# Patient Record
Sex: Male | Born: 1970 | Race: Black or African American | Hispanic: No | Marital: Married | Smoking: Never smoker
Health system: Southern US, Community
[De-identification: ages and names within clinical notes are randomized; demographics above are authoritative.]

## PROBLEM LIST (undated history)

## (undated) DIAGNOSIS — N200 Calculus of kidney: Secondary | ICD-10-CM

## (undated) HISTORY — PX: ARTHROSCOPIC REPAIR ACL: SUR80

---

## 2001-07-13 ENCOUNTER — Encounter: Payer: Self-pay | Admitting: Emergency Medicine

## 2001-07-13 ENCOUNTER — Emergency Department (HOSPITAL_COMMUNITY): Admission: EM | Admit: 2001-07-13 | Discharge: 2001-07-13 | Payer: Self-pay | Admitting: Emergency Medicine

## 2006-11-17 ENCOUNTER — Emergency Department: Payer: Self-pay | Admitting: Emergency Medicine

## 2006-11-24 ENCOUNTER — Ambulatory Visit: Payer: Self-pay | Admitting: Specialist

## 2020-04-16 ENCOUNTER — Encounter (HOSPITAL_BASED_OUTPATIENT_CLINIC_OR_DEPARTMENT_OTHER): Payer: Self-pay | Admitting: *Deleted

## 2020-04-16 ENCOUNTER — Other Ambulatory Visit: Payer: Self-pay

## 2020-04-16 ENCOUNTER — Emergency Department (HOSPITAL_BASED_OUTPATIENT_CLINIC_OR_DEPARTMENT_OTHER)
Admission: EM | Admit: 2020-04-16 | Discharge: 2020-04-16 | Disposition: A | Discharge status: Home / Self Care | Payer: No Typology Code available for payment source | Attending: Emergency Medicine | Admitting: Emergency Medicine

## 2020-04-16 ENCOUNTER — Emergency Department (HOSPITAL_BASED_OUTPATIENT_CLINIC_OR_DEPARTMENT_OTHER): Payer: No Typology Code available for payment source

## 2020-04-16 DIAGNOSIS — N132 Hydronephrosis with renal and ureteral calculous obstruction: Secondary | ICD-10-CM | POA: Diagnosis not present

## 2020-04-16 DIAGNOSIS — Z87442 Personal history of urinary calculi: Secondary | ICD-10-CM | POA: Insufficient documentation

## 2020-04-16 DIAGNOSIS — N23 Unspecified renal colic: Secondary | ICD-10-CM

## 2020-04-16 DIAGNOSIS — R109 Unspecified abdominal pain: Secondary | ICD-10-CM | POA: Diagnosis present

## 2020-04-16 HISTORY — DX: Calculus of kidney: N20.0

## 2020-04-16 LAB — URINALYSIS, MICROSCOPIC (REFLEX): WBC, UA: NONE SEEN WBC/hpf (ref 0–5)

## 2020-04-16 LAB — URINALYSIS, ROUTINE W REFLEX MICROSCOPIC
Bilirubin Urine: NEGATIVE
Glucose, UA: NEGATIVE mg/dL
Ketones, ur: NEGATIVE mg/dL
Leukocytes,Ua: NEGATIVE
Nitrite: NEGATIVE
Protein, ur: NEGATIVE mg/dL
Specific Gravity, Urine: 1.025 (ref 1.005–1.030)
pH: 6.5 (ref 5.0–8.0)

## 2020-04-16 MED ORDER — TAMSULOSIN HCL 0.4 MG PO CAPS: 0.4000 mg | ORAL_CAPSULE | Freq: Every day | ORAL | 0 refills | Status: AC

## 2020-04-16 MED ORDER — IBUPROFEN 800 MG PO TABS
800.0000 mg | ORAL_TABLET | Freq: Once | ORAL | Status: AC
  Administered 2020-04-16: 800 mg via ORAL
  Filled 2020-04-16: qty 1

## 2020-04-16 MED ORDER — IBUPROFEN 800 MG PO TABS: 800.0000 mg | ORAL_TABLET | Freq: Three times a day (TID) | ORAL | 0 refills | Status: AC

## 2020-04-16 MED ORDER — HYDROCODONE-ACETAMINOPHEN 5-325 MG PO TABS
2.0000 | ORAL_TABLET | Freq: Once | ORAL | Status: AC
  Administered 2020-04-16: 2 via ORAL
  Filled 2020-04-16: qty 2

## 2020-04-16 MED ORDER — HYDROCODONE-ACETAMINOPHEN 5-325 MG PO TABS
1.0000 | ORAL_TABLET | Freq: Four times a day (QID) | ORAL | 0 refills | Status: AC | PRN
Start: 2020-04-16 — End: ?

## 2020-04-16 MED ORDER — ONDANSETRON 4 MG PO TBDP: ORAL_TABLET | ORAL | 0 refills | Status: AC

## 2020-04-16 MED ORDER — ONDANSETRON 4 MG PO TBDP
4.0000 mg | ORAL_TABLET | Freq: Once | ORAL | Status: AC
Start: 2020-04-16 — End: 2020-04-16
  Administered 2020-04-16: 4 mg via ORAL
  Filled 2020-04-16: qty 1

## 2020-04-16 NOTE — ED Provider Notes (Signed)
[facility] EMERGENCY DEPARTMENT Provider Note   CSN: [id number] Arrival date & time: 04/16/20  1639     History Chief Complaint  Patient presents with  . Flank Pain    Wayne Green is a 49 y.o. male history of kidney stones who presented with acute onset left flank pain.  Patient has some soreness in the left flank area for the last week or so.  Patient was at work today and had acute onset of sharp left flank pain.  He states that he was so bad that he had to lay on the floor.  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  He had one episode of vomiting as well.  Denies any fevers or chills.  He states that he has a history of kidney stones.  He was diagnosed with kidney stones several months ago but never followed up with urology.  The history is provided by the patient.       Past Medical History:  Diagnosis Date  . Kidney calculi     There are no problems to display for this patient.   Past Surgical History:  Procedure Laterality Date  . ARTHROSCOPIC REPAIR ACL         No family history on file.  Social History   Tobacco Use  . Smoking status: Never Smoker  Substance Use Topics  . Alcohol use: Not Currently  . Drug use: Not Currently    Home Medications Prior to Admission medications   Not on File    Allergies    Patient has no known allergies.  Review of Systems   Review of Systems  Genitourinary: Positive for flank pain.  All other systems reviewed and are negative.   Physical Exam Updated Vital Signs BP 139/90   Pulse 73   Temp 98 F (36.7 C)   Resp 16   Ht 6\' 1"  (1.854 m)   Wt 111.1 kg   SpO2 100%   BMI 32.32 kg/m   Physical Exam Vitals and nursing note reviewed.  Constitutional:      Appearance: Normal appearance.  HENT:     Head: Normocephalic.     Nose: Nose normal.     Mouth/Throat:     Mouth: Mucous membranes are moist.  Eyes:     Extraocular Movements: Extraocular movements intact.     Pupils: Pupils are  equal, round, and reactive to light.  Cardiovascular:     Rate and Rhythm: Normal rate and regular rhythm.     Pulses: Normal pulses.     Heart sounds: Normal heart sounds.  Pulmonary:     Effort: Pulmonary effort is normal.     Breath sounds: Normal breath sounds.  Abdominal:     General: Abdomen is flat.     Palpations: Abdomen is soft.     Comments: + mild L CVAT   Musculoskeletal:        General: Normal range of motion.     Cervical back: Normal range of motion.  Skin:    General: Skin is warm.     Capillary Refill: Capillary refill takes less than 2 seconds.  Neurological:     General: No focal deficit present.     Mental Status: He is alert and oriented to person, place, and time.  Psychiatric:        Mood and Affect: Mood normal.        Behavior: Behavior normal.     ED Results / Procedures / Treatments  Labs (all labs ordered are listed, but only abnormal results are displayed) Labs Reviewed  URINALYSIS, ROUTINE W REFLEX MICROSCOPIC - Abnormal; Notable for the following components:      Result Value   Hgb urine dipstick TRACE (*)    All other components within normal limits  URINALYSIS, MICROSCOPIC (REFLEX) - Abnormal; Notable for the following components:   Bacteria, UA RARE (*)    All other components within normal limits    EKG None  Radiology CT Renal Stone Study  Result Date: 04/16/2020 CLINICAL DATA:  Flank pain, stone disease suspected, sudden flank pain for 2 hours, history of urolithiasis EXAM: CT ABDOMEN AND PELVIS WITHOUT CONTRAST TECHNIQUE: Multidetector CT imaging of the abdomen and pelvis was performed following the standard protocol without IV contrast. COMPARISON:  None. FINDINGS: Lower chest: Lung bases are clear. Normal heart size. No pericardial effusion. Hepatobiliary: No visible focal liver lesion within limitations of this unenhanced CT. Normal liver attenuation. Smooth liver surface contour. Gallbladder partially decompressed. No gross  gallbladder abnormality. No visible calcified gallstones are biliary ductal dilatation. Pancreas: No pancreatic ductal dilatation or surrounding inflammatory changes. Spleen: Normal in size. No concerning splenic lesions. Adrenals/Urinary Tract: Normal adrenal glands. Kidneys are symmetric in size and normally located. No concerning focal renal lesion. Mild left hydroureteronephrosis to the level of the urinary bladder where a 5 mm calculus is seen positioned at the left ureterovesicular junction. No other visible urolithiasis. No right urinary tract dilatation. Some mild bladder wall thickening may be related to underdistention or reactive change though could correlate with urinalysis. Stomach/Bowel: Small sliding-type hiatal hernia. Distal stomach and duodenum are unremarkable with normal sweep across the midline abdomen. No small bowel thickening or dilatation. Coiled noninflamed appendix near the cecal tip. No colonic dilatation or wall thickening. Vascular/Lymphatic: No significant vascular findings are present. No enlarged abdominal or pelvic lymph nodes. Reproductive: Borderline prostatomegaly. No concerning focal lesions. Few benign calcifications noted at the penile base. Other: No abdominopelvic free fluid or free gas. No bowel containing hernias. Tiny fat containing umbilical hernia. Small to moderate right and tiny left fat containing inguinal hernias. Musculoskeletal: Multilevel degenerative changes are present in the imaged portions of the spine. Additional degenerative changes in the hips and pelvis. No acute or worrisome osseous lesions. IMPRESSION: 1. Mild left hydroureteronephrosis to the level of the urinary bladder where a 5 mm calculus is seen positioned at the left ureterovesicular junction. 2. Mild bladder wall thickening may be related to underdistention or reactive change though could correlate with urinalysis to exclude cystitis. 3. Small sliding-type hiatal hernia. 4. Small to moderate  right and tiny left fat containing inguinal hernias. Electronically Signed   By: Kreg Shropshire M.D.   On: 04/16/2020 17:52    Procedures Procedures (including critical care time)  Medications Ordered in ED Medications  ondansetron (ZOFRAN-ODT) disintegrating tablet 4 mg (has no administration in time range)  ibuprofen (ADVIL) tablet 800 mg (has no administration in time range)  HYDROcodone-acetaminophen (NORCO/VICODIN) 5-325 MG per tablet 2 tablet (has no administration in time range)    ED Course  I have reviewed the triage vital signs and the nursing notes.  Pertinent labs & imaging results that were available during my care of the patient were reviewed by me and considered in my medical decision making (see chart for details).    MDM Rules/Calculators/A&P                         Earl Lites  P Rippeon is a 49 y.o. male here presenting with left flank pain.  Patient is well-appearing afebrile.  Vitals are stable.  CT showed mild left hydro from 5 mm calculus.  UA showed no UTI.  Will discharge home with Motrin, Vicodin, Flomax and urology follow-up.   Final Clinical Impression(s) / ED Diagnoses Final diagnoses:  None    Rx / DC Orders ED Discharge Orders    None       Charlynne Pander, MD 04/16/20 1811

## 2020-04-16 NOTE — Discharge Instructions (Addendum)
Take Motrin for pain.  Take Vicodin for severe pain.  Take Flomax daily.  Take Zofran for nausea  You have a kidney stone and you need to see urologist for follow-up  Return to ER if you have worse abdominal pain, vomiting, flank pain, fevers.

## 2020-04-16 NOTE — ED Triage Notes (Signed)
C/o sudden left flank pain x 2 hrs , HX stones

## 2022-03-08 IMAGING — CT CT RENAL STONE PROTOCOL
2 of 4 series · 15 of 46 positions shown, 17 images · non-contrast
Comparison: None.

CLINICAL DATA: Flank pain, stone disease suspected, sudden flank
pain for 2 hours, history of urolithiasis

EXAM:
CT ABDOMEN AND PELVIS WITHOUT CONTRAST
TECHNIQUE: Multidetector CT imaging of the abdomen and pelvis was performed
following the standard protocol without IV contrast.

[Series 2: axial st · axial · 0.89mm/px · z∈[+330,+770]mm · 12 of 97 slices shown, 14 images]
[im 5/97  soft-tissue]
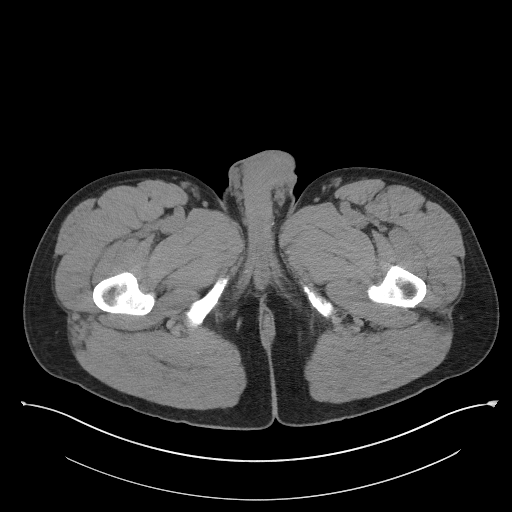
[im 5/97  bone]
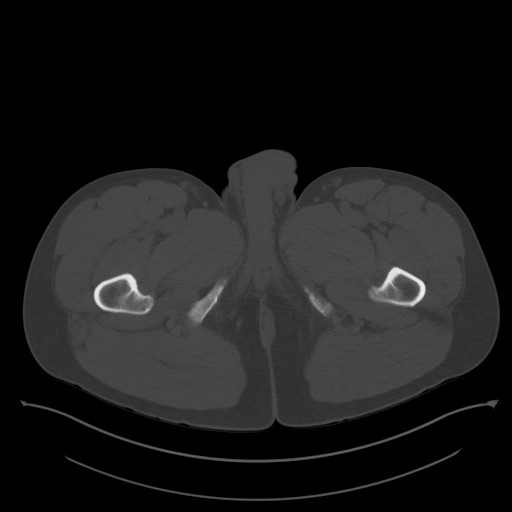
[im 13/97  soft-tissue]
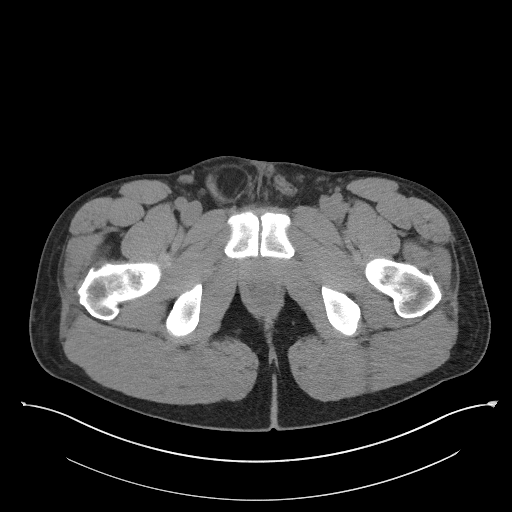
[im 21/97  soft-tissue]
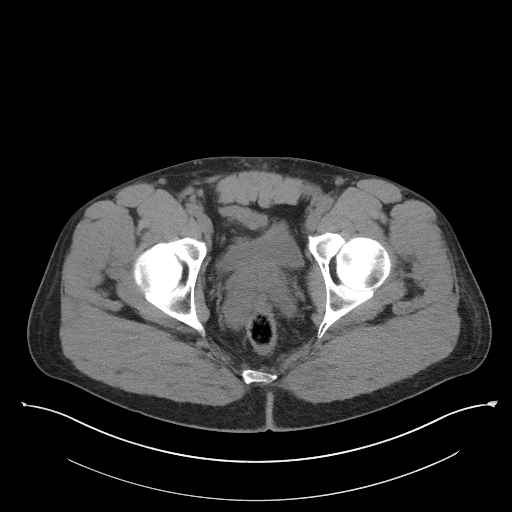
[im 29/97  soft-tissue]
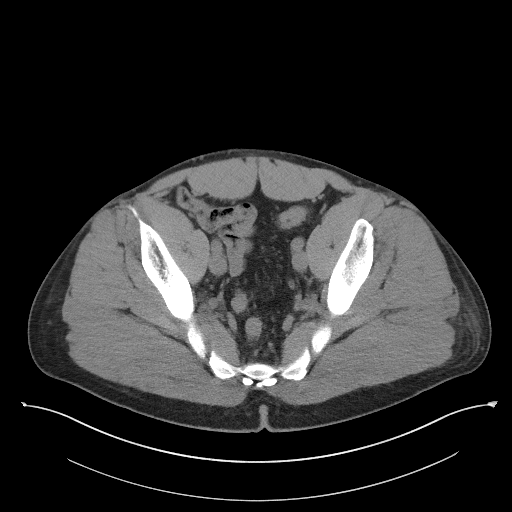
[im 37/97  soft-tissue]
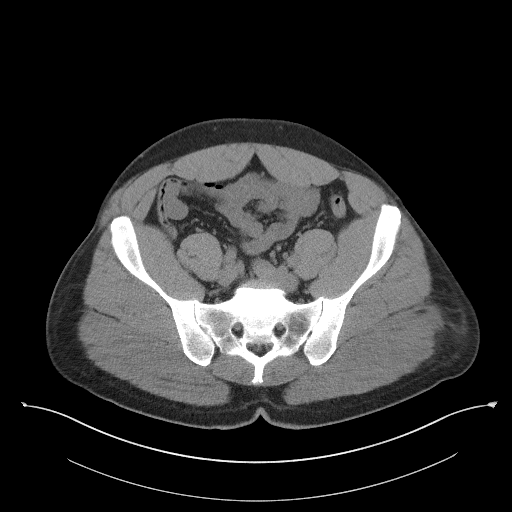
[im 45/97  soft-tissue]
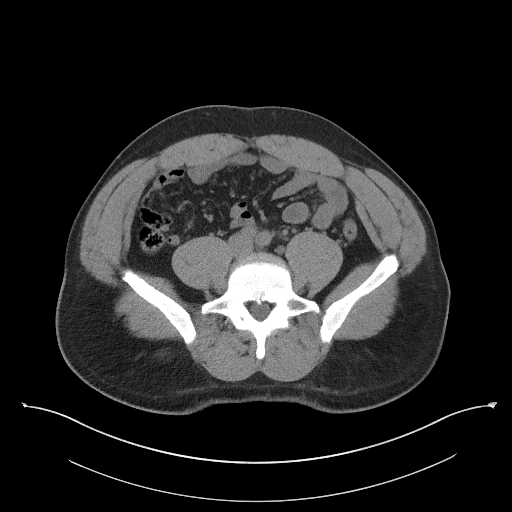
[im 53/97  soft-tissue]
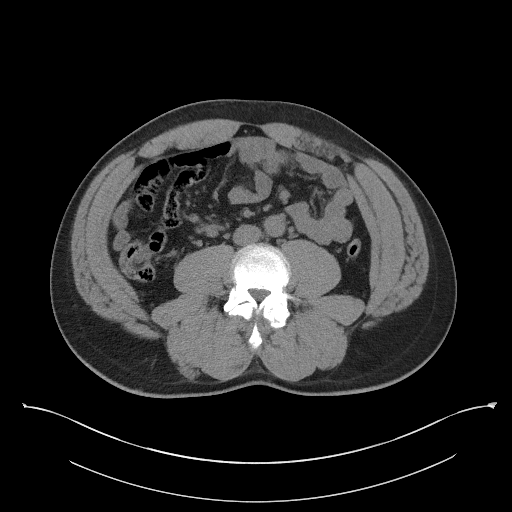
[im 61/97  soft-tissue]
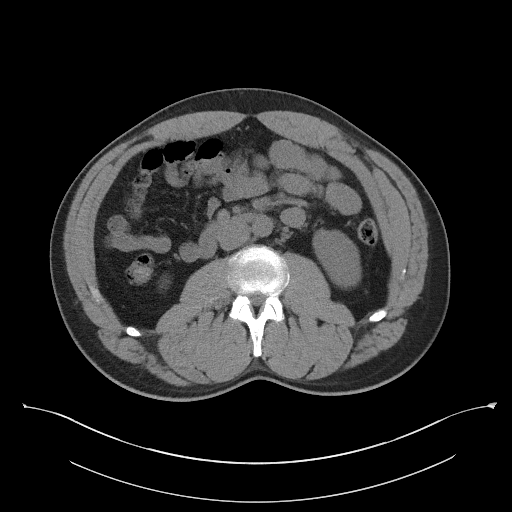
[im 69/97  soft-tissue]
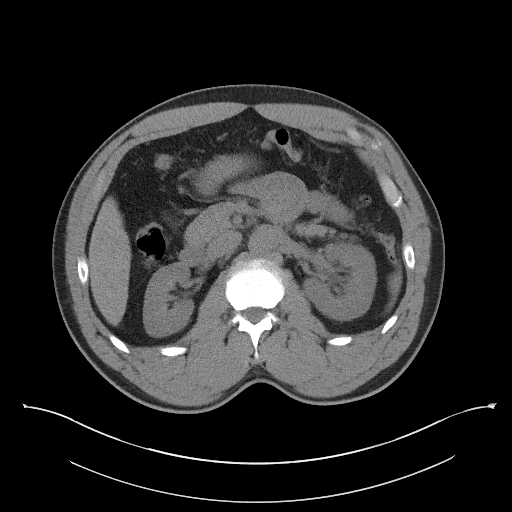
[im 69/97  bone]
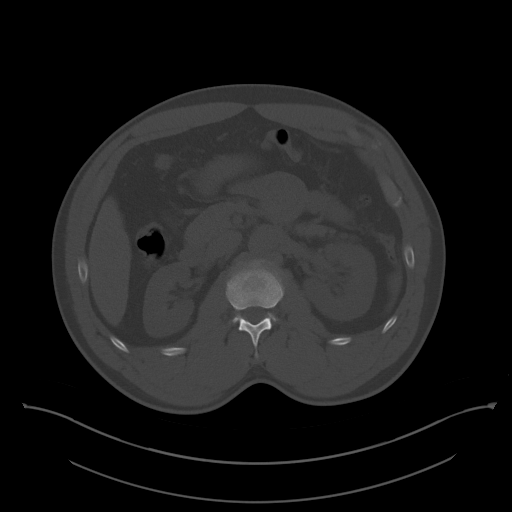
[im 77/97  soft-tissue]
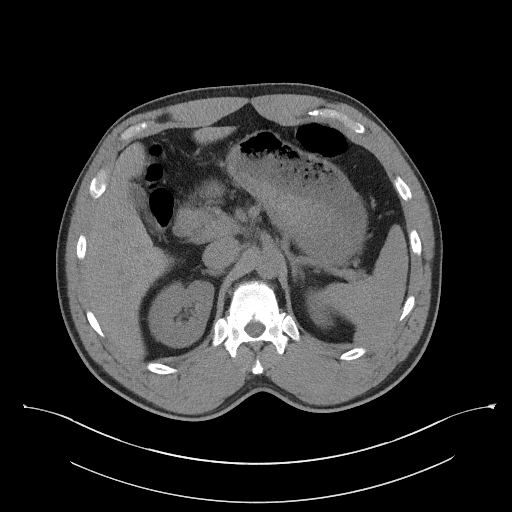
[im 85/97  soft-tissue]
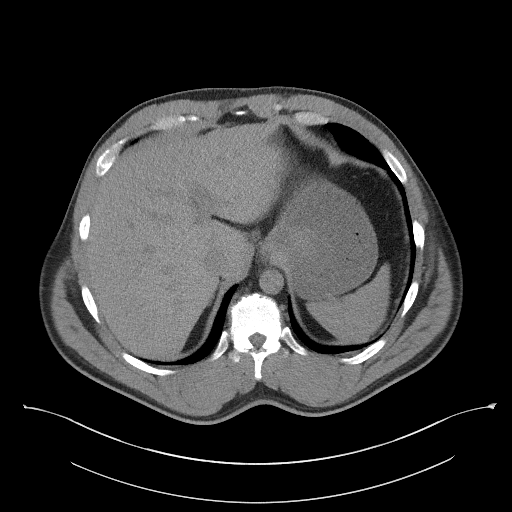
[im 93/97  soft-tissue]
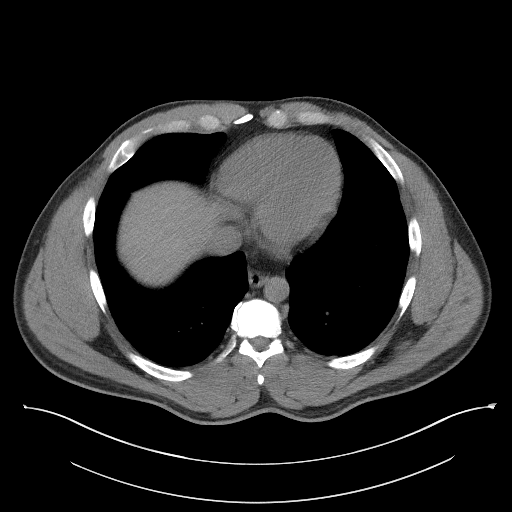

[Series 4: coronal st · coronal · 0.85mm/px · 3 of 110 slices shown]
[im 37/110  soft-tissue]
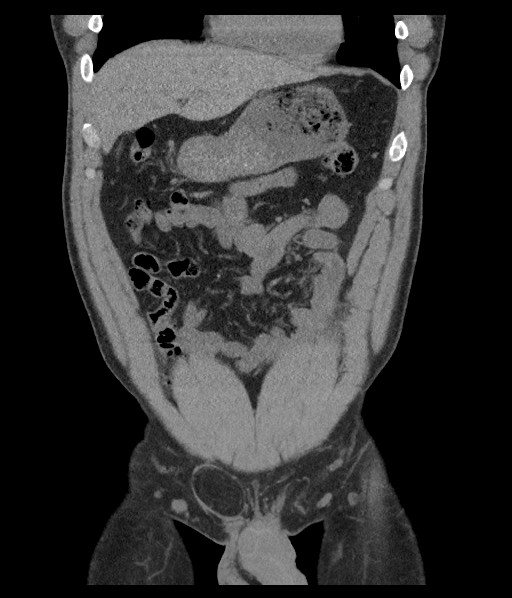
[im 49/110  soft-tissue]
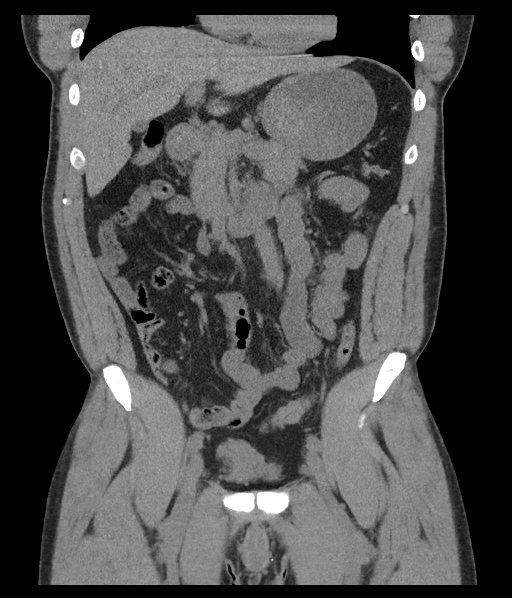
[im 61/110  soft-tissue]
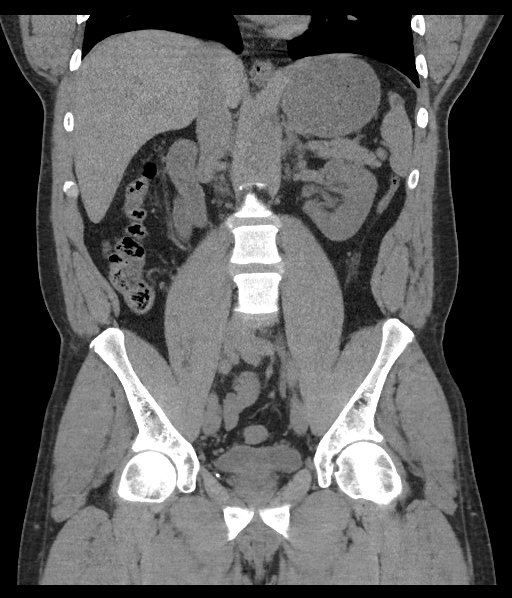

[15 of 46 positions shown; findings below may reference images not displayed]

FINDINGS: Lower chest: Lung bases are clear. Normal heart size. No pericardial
effusion.

Hepatobiliary: No visible focal liver lesion within limitations of
this unenhanced CT. Normal liver attenuation. Smooth liver surface
contour. Gallbladder partially decompressed. No gross gallbladder
abnormality. No visible calcified gallstones are biliary ductal
dilatation.

Pancreas: No pancreatic ductal dilatation or surrounding
inflammatory changes.

Spleen: Normal in size. No concerning splenic lesions.

Adrenals/Urinary Tract: Normal adrenal glands. Kidneys are symmetric
in size and normally located. No concerning focal renal lesion. Mild
left hydroureteronephrosis to the level of the urinary bladder where
a 5 mm calculus is seen positioned at the left ureterovesicular
junction. No other visible urolithiasis. No right urinary tract
dilatation. Some mild bladder wall thickening may be related to
underdistention or reactive change though could correlate with
urinalysis.

Stomach/Bowel: Small sliding-type hiatal hernia. Distal stomach and
duodenum are unremarkable with normal sweep across the midline
abdomen. No small bowel thickening or dilatation. Coiled noninflamed
appendix near the cecal tip. No colonic dilatation or wall
thickening.

Vascular/Lymphatic: No significant vascular findings are present. No
enlarged abdominal or pelvic lymph nodes.

Reproductive: Borderline prostatomegaly. No concerning focal
lesions. Few benign calcifications noted at the penile base.

Other: No abdominopelvic free fluid or free gas. No bowel containing
hernias. Tiny fat containing umbilical hernia. Small to moderate
right and tiny left fat containing inguinal hernias.

Musculoskeletal: Multilevel degenerative changes are present in the
imaged portions of the spine. Additional degenerative changes in the
hips and pelvis. No acute or worrisome osseous lesions.
IMPRESSION: 1. Mild left hydroureteronephrosis to the level of the urinary
bladder where a 5 mm calculus is seen positioned at the left
ureterovesicular junction.
2. Mild bladder wall thickening may be related to underdistention or
reactive change though could correlate with urinalysis to exclude
cystitis.
3. Small sliding-type hiatal hernia.
4. Small to moderate right and tiny left fat containing inguinal
hernias.
